# Patient Record
Sex: Male | Born: 1980 | Hispanic: Yes | Marital: Married | Smoking: Never smoker
Health system: Southern US, Community
[De-identification: ages and names within clinical notes are randomized; demographics above are authoritative.]

---

## 2011-12-19 ENCOUNTER — Ambulatory Visit
Admission: RE | Admit: 2011-12-19 | Discharge: 2011-12-19 | Disposition: A | Discharge status: Home / Self Care | Payer: Self-pay | Source: Ambulatory Visit | Attending: Family Medicine | Admitting: Family Medicine

## 2011-12-19 ENCOUNTER — Other Ambulatory Visit: Payer: Self-pay | Admitting: Family Medicine

## 2011-12-19 DIAGNOSIS — L299 Pruritus, unspecified: Secondary | ICD-10-CM

## 2019-06-06 ENCOUNTER — Other Ambulatory Visit: Payer: Self-pay

## 2019-06-06 ENCOUNTER — Emergency Department (HOSPITAL_BASED_OUTPATIENT_CLINIC_OR_DEPARTMENT_OTHER)
Admission: EM | Admit: 2019-06-06 | Discharge: 2019-06-06 | Disposition: A | Discharge status: Home / Self Care | Payer: BC Managed Care – PPO | Attending: Emergency Medicine | Admitting: Emergency Medicine

## 2019-06-06 ENCOUNTER — Emergency Department (HOSPITAL_BASED_OUTPATIENT_CLINIC_OR_DEPARTMENT_OTHER): Payer: BC Managed Care – PPO

## 2019-06-06 ENCOUNTER — Encounter (HOSPITAL_BASED_OUTPATIENT_CLINIC_OR_DEPARTMENT_OTHER): Payer: Self-pay

## 2019-06-06 DIAGNOSIS — R109 Unspecified abdominal pain: Secondary | ICD-10-CM

## 2019-06-06 DIAGNOSIS — R1032 Left lower quadrant pain: Secondary | ICD-10-CM | POA: Insufficient documentation

## 2019-06-06 LAB — URINALYSIS, ROUTINE W REFLEX MICROSCOPIC
Bilirubin Urine: NEGATIVE
Glucose, UA: 100 mg/dL — AB
Hgb urine dipstick: NEGATIVE
Ketones, ur: NEGATIVE mg/dL
Leukocytes,Ua: NEGATIVE
Nitrite: NEGATIVE
Protein, ur: NEGATIVE mg/dL
Specific Gravity, Urine: 1.01 (ref 1.005–1.030)
pH: 6 (ref 5.0–8.0)

## 2019-06-06 LAB — CBC
HCT: 43.8 % (ref 39.0–52.0)
Hemoglobin: 15.4 g/dL (ref 13.0–17.0)
MCH: 31.6 pg (ref 26.0–34.0)
MCHC: 35.2 g/dL (ref 30.0–36.0)
MCV: 89.9 fL (ref 80.0–100.0)
Platelets: 247 10*3/uL (ref 150–400)
RBC: 4.87 MIL/uL (ref 4.22–5.81)
RDW: 12.1 % (ref 11.5–15.5)
WBC: 11.9 10*3/uL — ABNORMAL HIGH (ref 4.0–10.5)
nRBC: 0 % (ref 0.0–0.2)

## 2019-06-06 LAB — COMPREHENSIVE METABOLIC PANEL
ALT: 22 U/L (ref 0–44)
AST: 18 U/L (ref 15–41)
Albumin: 4.6 g/dL (ref 3.5–5.0)
Alkaline Phosphatase: 51 U/L (ref 38–126)
Anion gap: 9 (ref 5–15)
BUN: 12 mg/dL (ref 6–20)
CO2: 25 mmol/L (ref 22–32)
Calcium: 9.3 mg/dL (ref 8.9–10.3)
Chloride: 104 mmol/L (ref 98–111)
Creatinine, Ser: 0.84 mg/dL (ref 0.61–1.24)
GFR calc Af Amer: >60 mL/min (ref >60)
GFR calc non Af Amer: >60 mL/min (ref >60)
Glucose, Bld: 105 mg/dL — ABNORMAL HIGH (ref 70–99)
Potassium: 4 mmol/L (ref 3.5–5.1)
Sodium: 138 mmol/L (ref 135–145)
Total Bilirubin: 1.9 mg/dL — ABNORMAL HIGH (ref 0.3–1.2)
Total Protein: 7.3 g/dL (ref 6.5–8.1)

## 2019-06-06 LAB — LIPASE, BLOOD: Lipase: 21 U/L (ref 11–51)

## 2019-06-06 MED ORDER — FENTANYL CITRATE (PF) 100 MCG/2ML IJ SOLN
50.0000 ug | Freq: Once | INTRAMUSCULAR | Status: AC
  Administered 2019-06-06: 50 ug via INTRAVENOUS
  Filled 2019-06-06: qty 2

## 2019-06-06 MED ORDER — SODIUM CHLORIDE 0.9% FLUSH
3.0000 mL | Freq: Once | INTRAVENOUS | Status: DC
  Filled 2019-06-06: qty 3

## 2019-06-06 MED ORDER — IOHEXOL 300 MG/ML  SOLN
100.0000 mL | Freq: Once | INTRAMUSCULAR | Status: AC | PRN
  Administered 2019-06-06: 100 mL via INTRAVENOUS

## 2019-06-06 NOTE — Discharge Instructions (Addendum)
Recommend taking Tylenol Motrin as needed for your pain.  If your pain becomes worse, you have vomiting, fever, recommend return to ER for recheck.  Otherwise recommend follow-up with your primary doctor next week for recheck.

## 2019-06-06 NOTE — ED Provider Notes (Signed)
[facility] EMER[facility]ENCY DEPARTMENT Provider Note   CSN: [id number] Arrival date & time: 06/06/19  1228     History Chief Complaint  Patient presents with  . Abdominal Pain    Wyatt Soto is a 39 y.o. male.  Presents to ER with chief complaint of abdominal pain.  Patient reports since yesterday has been having left lower quadrant abdominal pain, no associated vomiting or diarrhea, had bowel movement yesterday, grossly normal, no blood.  Pain is dull, achy, moderate.  Does not want anything for pain currently, has not taken any medicine for pain.  Denies any chronic medical conditions, no past surgical history.  HPI     History reviewed. No pertinent past medical history.  There are no problems to display for this patient.   History reviewed. No pertinent surgical history.     No family history on file.  Social History   Tobacco Use  . Smoking status: Never Smoker  . Smokeless tobacco: Never Used  Substance Use Topics  . Alcohol use: Yes    Comment: occ  . Drug use: Never    Home Medications Prior to Admission medications   Not on File    Allergies    Patient has no known allergies.  Review of Systems   Review of Systems  Constitutional: Negative for chills and fever.  HENT: Negative for ear pain and sore throat.   Eyes: Negative for pain and visual disturbance.  Respiratory: Negative for cough and shortness of breath.   Cardiovascular: Negative for chest pain and palpitations.  Gastrointestinal: Positive for abdominal pain. Negative for vomiting.  Genitourinary: Negative for dysuria and hematuria.  Musculoskeletal: Negative for arthralgias and back pain.  Skin: Negative for color change and rash.  Neurological: Negative for seizures and syncope.  All other systems reviewed and are negative.   Physical Exam Updated Vital Signs BP 114/81 (BP Location: Left Arm)   Pulse 72   Temp 98.2 F (36.8 C) (Oral)   Resp 18   Ht 5\' 6"  (1.676 m)   Wt  78.9 kg   SpO2 100%   BMI 28.08 kg/m   Physical Exam Vitals and nursing note reviewed.  Constitutional:      Appearance: He is well-developed.  HENT:     Head: Normocephalic and atraumatic.  Eyes:     Conjunctiva/sclera: Conjunctivae normal.  Cardiovascular:     Rate and Rhythm: Normal rate and regular rhythm.     Heart sounds: No murmur.  Pulmonary:     Effort: Pulmonary effort is normal. No respiratory distress.     Breath sounds: Normal breath sounds.  Abdominal:     Palpations: Abdomen is soft.     Tenderness: There is abdominal tenderness in the left lower quadrant. There is no guarding or rebound.     Comments: There is some tenderness in the left lower quadrant but no generalized rebound or guarding  Musculoskeletal:     Cervical back: Neck supple.  Skin:    General: Skin is warm and dry.     Capillary Refill: Capillary refill takes less than 2 seconds.  Neurological:     General: No focal deficit present.     Mental Status: He is alert and oriented to person, place, and time.  Psychiatric:        Mood and Affect: Mood normal.        Behavior: Behavior normal.     ED Results / Procedures / Treatments   Labs (all labs ordered are  listed, but only abnormal results are displayed) Labs Reviewed  COMPREHENSIVE METABOLIC PANEL - Abnormal; Notable for the following components:      Result Value   Glucose, Bld 105 (*)    Total Bilirubin 1.9 (*)    All other components within normal limits  CBC - Abnormal; Notable for the following components:   WBC 11.9 (*)    All other components within normal limits  URINALYSIS, ROUTINE W REFLEX MICROSCOPIC - Abnormal; Notable for the following components:   Glucose, UA 100 (*)    All other components within normal limits  LIPASE, BLOOD    EKG None  Radiology CT ABDOMEN PELVIS W CONTRAST  Result Date: 06/06/2019 CLINICAL DATA:  LEFT lower abdominal pain since yesterday, nausea today, suspected diverticulitis EXAM: CT  ABDOMEN AND PELVIS WITH CONTRAST TECHNIQUE: Multidetector CT imaging of the abdomen and pelvis was performed using the standard protocol following bolus administration of intravenous contrast. Sagittal and coronal MPR images reconstructed from axial data set. CONTRAST:  OMNIPAQUE IOHEXOL 300 MG/ML SOLN IV. No oral contrast. COMPARISON:  None FINDINGS: Lower chest: Lung bases clear Hepatobiliary: Gallbladder and liver normal appearance Pancreas: Normal appearance Spleen: Normal appearance Adrenals/Urinary Tract: Adrenal glands, kidneys, ureters, and bladder normal appearance Stomach/Bowel: Normal appendix, coiled adjacent to cecal tip. Stomach and small bowel loops normal appearance. Question mild distal descending and proximal sigmoid wall thickening, cannot exclude subtle colitis. No colonic diverticula or signs of diverticulitis identified. Prominent stool RIGHT colon. Vascular/Lymphatic: Vascular structures patent. Retroaortic LEFT renal vein. Aorta normal caliber. No adenopathy. Reproductive: Minimal prostatic enlargement. Other: Small BILATERAL inguinal hernias containing fat. No free air. Trace pelvic ascites. 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 IMPRESSION: Question mild distal descending and proximal sigmoid wall thickening, cannot exclude subtle colitis. Trace pelvic ascites. Small BILATERAL inguinal hernias containing fat. 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 Electronically Signed   By: Ulyses Southward M.D.   On: 06/06/2019 14:06    Procedures Procedures (including critical care time)  Medications Ordered in ED Medications  sodium chloride flush (NS) 0.9 % injection 3 mL (3 mLs Intravenous Not Given 06/06/19 1259)  fentaNYL (SUBLIMAZE) injection 50 mcg (50 mcg Intravenous Given 06/06/19 1354)  iohexol (OMNIPAQUE) 300 MG/ML solution 100 mL (100 mLs Intravenous Contrast Given 06/06/19 1344)    ED Course  I have reviewed  the triage vital signs and the nursing notes.  Pertinent labs & imaging results that were available during my care of the patient were reviewed by me and considered in my medical decision making (see chart for details).    MDM Rules/Calculators/A&P                      39 year old male who presented to the emergency room with a chief complaint of left lower quadrant pain, noted mild tenderness on exam but no rebound or guarding, normal vital signs.  Labs were grossly within normal limits, given his focal tenderness, obtain CT imaging.  No diverticulitis, no nephrolithiasis.  Radiologist commented "Question mild distal descending and proximal sigmoid wall thickening, cannot exclude subtle colitis".  At this point, given his symptoms and the CT, do not feel patient warrants antibiotics at this time.  Recommend recheck with primary doctor, return to ER for any worsening of his symptoms.    After the discussed management above, the patient was determined to be safe for discharge.  The patient was in agreement with this plan and all questions regarding  their care were answered.  ED return precautions were discussed and the patient will return to the ED with any significant worsening of condition.  Final Clinical Impression(s) / ED Diagnoses Final diagnoses:  Abdominal pain, unspecified abdominal location    Rx / DC Orders ED Discharge Orders    None       Milagros Loll, MD 06/06/19 1429

## 2019-06-06 NOTE — ED Notes (Signed)
Patient transported to CT 

## 2019-06-06 NOTE — ED Triage Notes (Signed)
t c/o LLQ pain started yesterday-denies v/d-sent from [facility]-steady gait

## 2020-12-10 IMAGING — CT CT ABD-PELV W/ CM
2 of 4 series · 16 of 46 positions shown, 18 images · IV contrast (Omnipaque)
Comparison: None

CLINICAL DATA: LEFT lower abdominal pain since yesterday, nausea
today, suspected diverticulitis

EXAM:
CT ABDOMEN AND PELVIS WITH CONTRAST
TECHNIQUE: Multidetector CT imaging of the abdomen and pelvis was performed
using the standard protocol following bolus administration of
intravenous contrast. Sagittal and coronal MPR images reconstructed
from axial data set.
CONTRAST:  100mL OMNIPAQUE IOHEXOL 300 MG/ML SOLN IV. No oral
contrast.

[Series 2: axial st · axial · 0.86mm/px · z∈[-550,-40]mm · 13 of 112 slices shown, 15 images]
[im 5/112  soft-tissue]
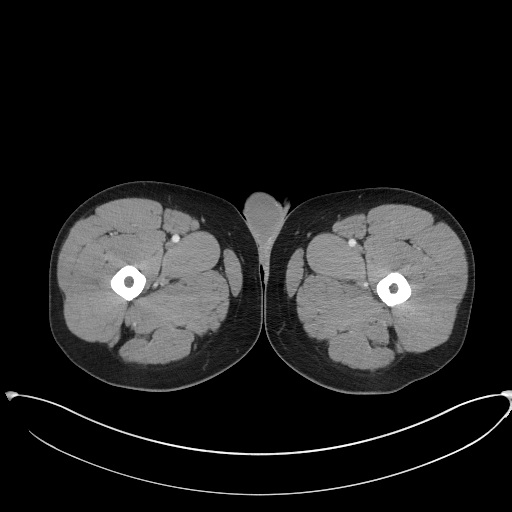
[im 5/112  bone]
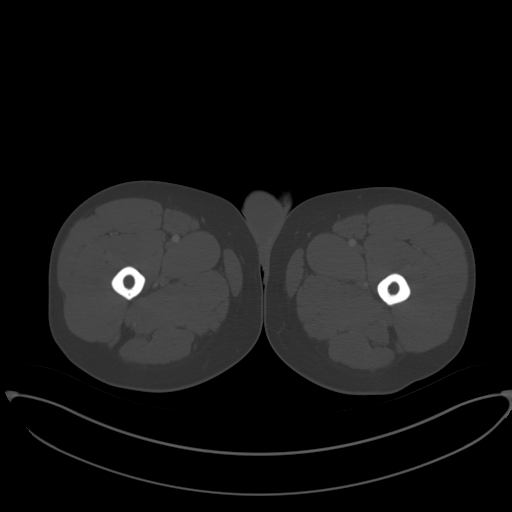
[im 13/112  soft-tissue]
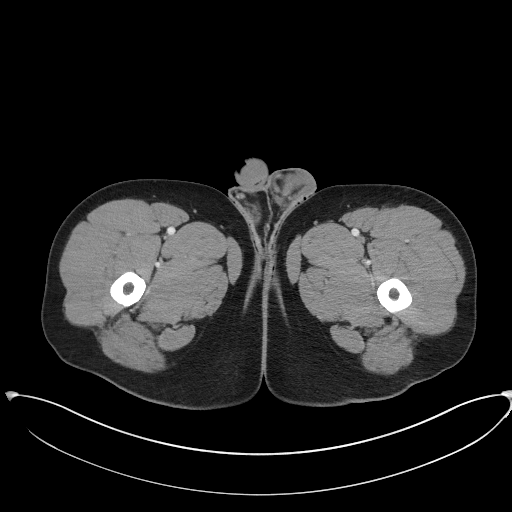
[im 22/112  soft-tissue]
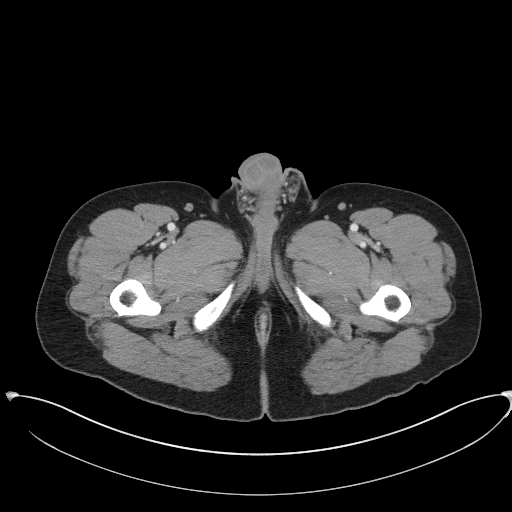
[im 30/112  soft-tissue]
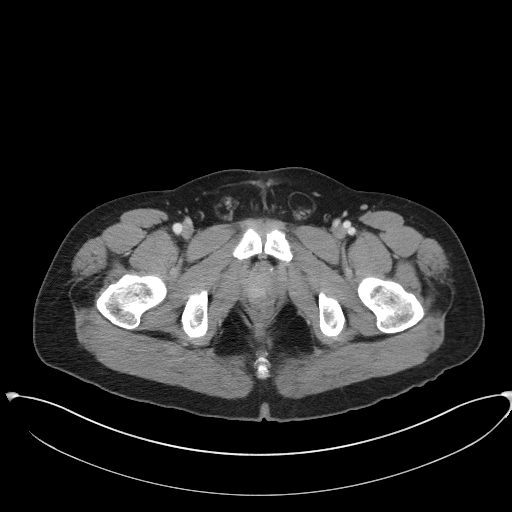
[im 39/112  soft-tissue]
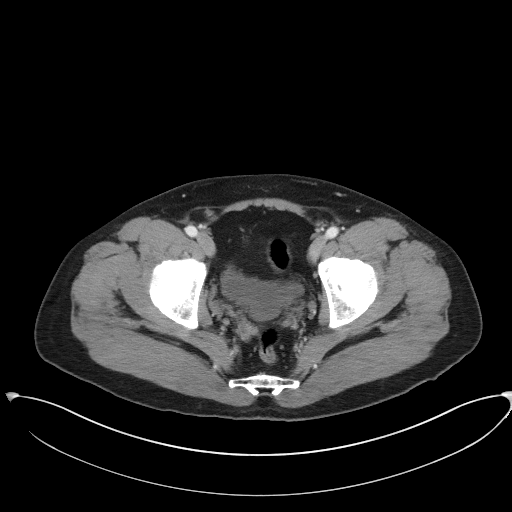
[im 47/112  soft-tissue]
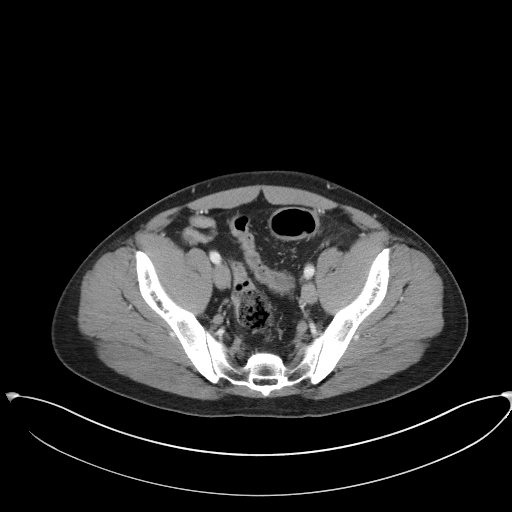
[im 56/112  soft-tissue]
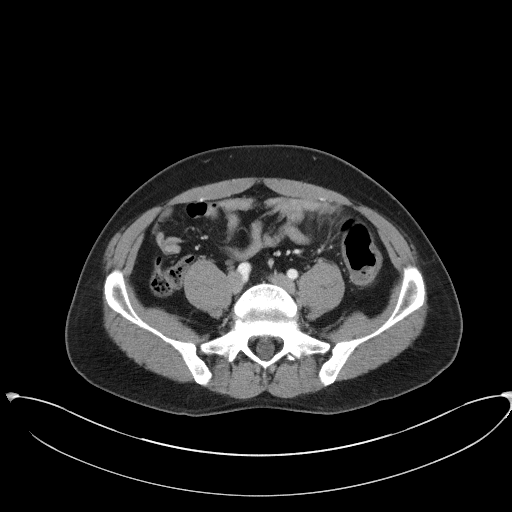
[im 65/112  soft-tissue]
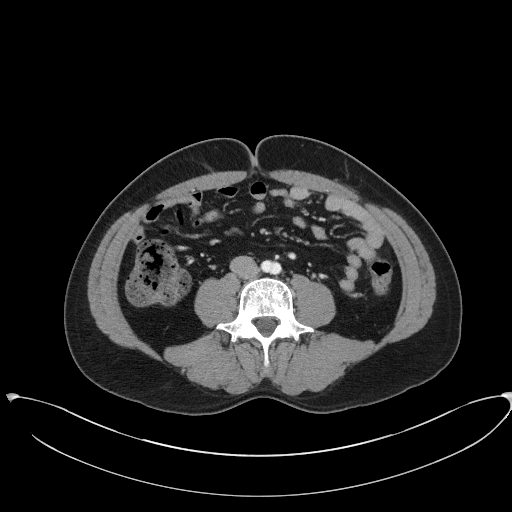
[im 73/112  soft-tissue]
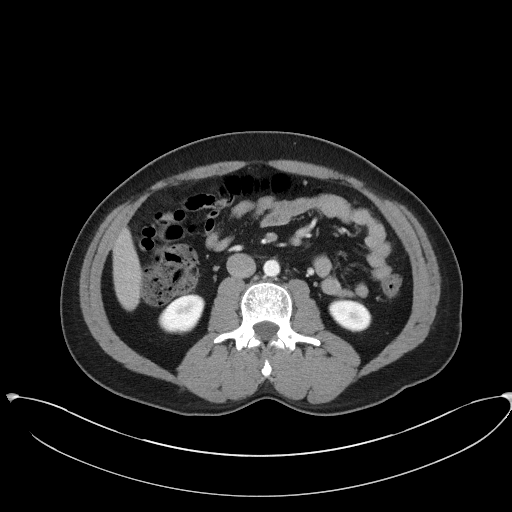
[im 73/112  bone]
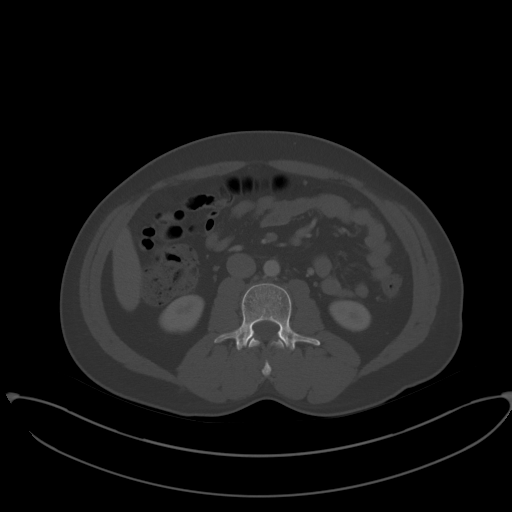
[im 82/112  soft-tissue]
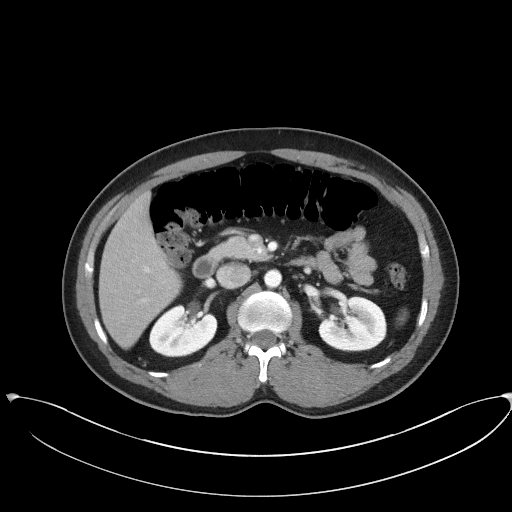
[im 90/112  soft-tissue]
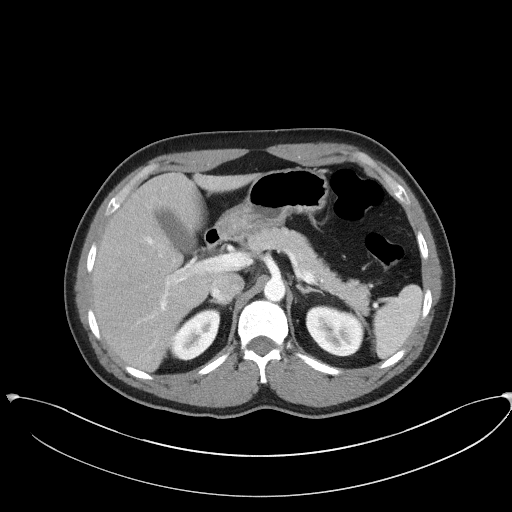
[im 99/112  soft-tissue]
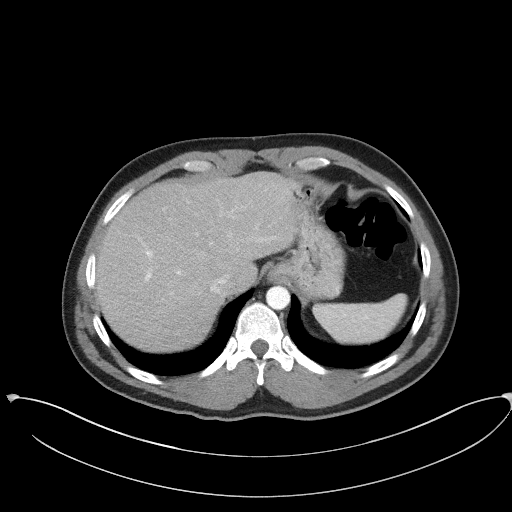
[im 107/112  soft-tissue]
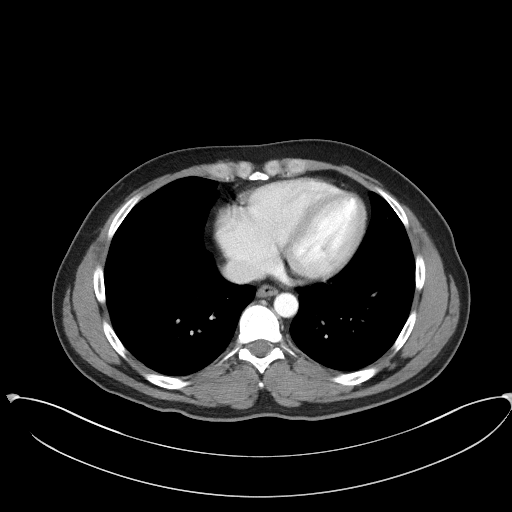

[Series 5: coronal st · coronal · 0.83mm/px · 3 of 85 slices shown]
[im 29/85  soft-tissue]
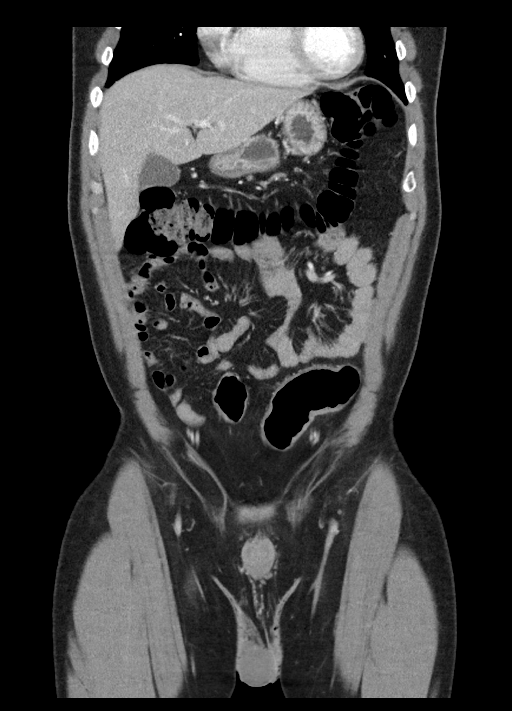
[im 38/85  soft-tissue]
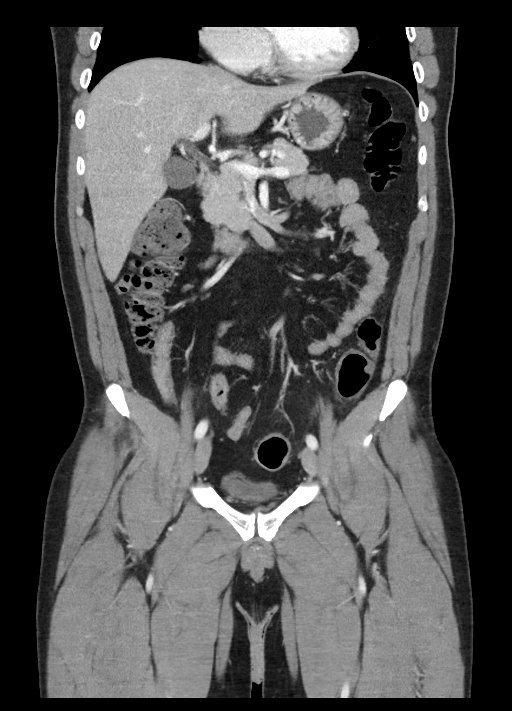
[im 47/85  soft-tissue]
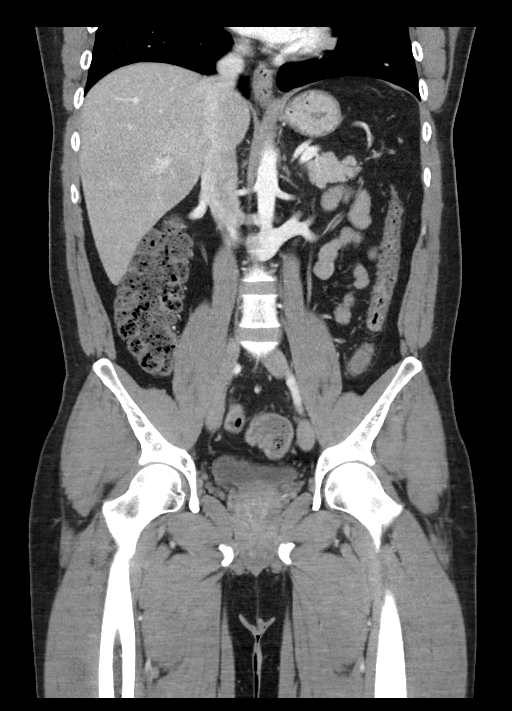

[16 of 46 positions shown; findings below may reference images not displayed]

FINDINGS: Lower chest: Lung bases clear

Hepatobiliary: Gallbladder and liver normal appearance

Pancreas: Normal appearance

Spleen: Normal appearance

Adrenals/Urinary Tract: Adrenal glands, kidneys, ureters, and
bladder normal appearance

Stomach/Bowel: Normal appendix, coiled adjacent to cecal tip.
Stomach and small bowel loops normal appearance. Question mild
distal descending and proximal sigmoid wall thickening, cannot
exclude subtle colitis. No colonic diverticula or signs of
diverticulitis identified. Prominent stool RIGHT colon.

Vascular/Lymphatic: Vascular structures patent. Retroaortic LEFT
renal vein. Aorta normal caliber. No adenopathy.

Reproductive: Minimal prostatic enlargement.

Other: Small BILATERAL inguinal hernias containing fat. No free air.
Trace pelvic ascites.

Musculoskeletal: BILATERAL spondylolysis L5 with minimal grade 1
spondylolisthesis.
IMPRESSION: Question mild distal descending and proximal sigmoid wall
thickening, cannot exclude subtle colitis.

Trace pelvic ascites.

Small BILATERAL inguinal hernias containing fat.

BILATERAL spondylolysis L5 with minimal grade 1 spondylolisthesis.
